# Patient Record
Sex: Male | Born: 1965 | Race: White | Hispanic: No | Marital: Married | Smoking: Never smoker
Health system: Southern US, Community
[De-identification: ages and names within clinical notes are randomized; demographics above are authoritative.]

---

## 2002-02-23 ENCOUNTER — Emergency Department (HOSPITAL_COMMUNITY): Admission: EM | Admit: 2002-02-23 | Discharge: 2002-02-23 | Payer: Self-pay | Admitting: Emergency Medicine

## 2002-02-23 ENCOUNTER — Encounter: Payer: Self-pay | Admitting: Emergency Medicine

## 2004-04-03 ENCOUNTER — Emergency Department (HOSPITAL_COMMUNITY): Admission: EM | Admit: 2004-04-03 | Discharge: 2004-04-04 | Payer: Self-pay | Admitting: Emergency Medicine

## 2009-08-26 ENCOUNTER — Emergency Department (HOSPITAL_COMMUNITY): Admission: EM | Admit: 2009-08-26 | Discharge: 2009-08-26 | Payer: Self-pay | Admitting: Emergency Medicine

## 2010-02-05 ENCOUNTER — Emergency Department (HOSPITAL_COMMUNITY): Admission: EM | Admit: 2010-02-05 | Discharge: 2010-02-05 | Payer: Self-pay | Admitting: Family Medicine

## 2010-02-14 ENCOUNTER — Ambulatory Visit: Payer: Self-pay | Admitting: Internal Medicine

## 2010-02-14 DIAGNOSIS — D509 Iron deficiency anemia, unspecified: Secondary | ICD-10-CM

## 2010-02-14 DIAGNOSIS — S8990XA Unspecified injury of unspecified lower leg, initial encounter: Secondary | ICD-10-CM | POA: Insufficient documentation

## 2010-02-14 DIAGNOSIS — K279 Peptic ulcer, site unspecified, unspecified as acute or chronic, without hemorrhage or perforation: Secondary | ICD-10-CM | POA: Insufficient documentation

## 2010-02-14 DIAGNOSIS — S99929A Unspecified injury of unspecified foot, initial encounter: Secondary | ICD-10-CM

## 2010-02-14 DIAGNOSIS — H9190 Unspecified hearing loss, unspecified ear: Secondary | ICD-10-CM | POA: Insufficient documentation

## 2010-02-14 DIAGNOSIS — K219 Gastro-esophageal reflux disease without esophagitis: Secondary | ICD-10-CM | POA: Insufficient documentation

## 2010-02-14 DIAGNOSIS — H6120 Impacted cerumen, unspecified ear: Secondary | ICD-10-CM

## 2010-02-14 DIAGNOSIS — R51 Headache: Secondary | ICD-10-CM

## 2010-02-14 DIAGNOSIS — S99919A Unspecified injury of unspecified ankle, initial encounter: Secondary | ICD-10-CM

## 2010-02-14 DIAGNOSIS — M7989 Other specified soft tissue disorders: Secondary | ICD-10-CM

## 2010-02-14 DIAGNOSIS — R519 Headache, unspecified: Secondary | ICD-10-CM | POA: Insufficient documentation

## 2010-08-13 NOTE — Assessment & Plan Note (Signed)
Summary: NEW / [employer] / # / CD   Vital Signs:  Patient profile:   45 year old male Height:      72 inches Weight:      280.50 pounds BMI:     38.18 O2 Sat:      96 % on Room air Temp:     98.4 degrees F oral Pulse rate:   88 / minute Pulse rhythm:   regular Resp:     16 per minute BP sitting:   136 / 88  (left arm) Cuff size:   large  Vitals Entered By: Rock Nephew CMA (February 14, 2010 4:07 PM)  Nutrition Counseling: Patient's BMI is greater than 25 and therefore counseled on weight management options.  O2 Flow:  Room air  Primary Care Provider:  Etta Grandchild MD   History of Present Illness: New to me he complains of a one year hx. of feeling like there is fluid in his ears with a decreased level of hearing.  Also, he has persistent pain and swelling in his right calf after he injured it 2 months ago when he jumped into a pool.  Preventive Screening-Counseling & Management  Alcohol-Tobacco     Alcohol drinks/day: 4     Alcohol type: beer     >5/day in last 3 mos: yes     Alcohol Counseling: to decrease amount and/or frequency of alcohol intake     Feels need to cut down: no     Feels annoyed by complaints: no     Feels guilty re: drinking: no     Needs 'eye opener' in am: no     Smoking Status: never  Caffeine-Diet-Exercise     Does Patient Exercise: no  Hep-HIV-STD-Contraception     Hepatitis Risk: no risk noted     HIV Risk: no risk noted     STD Risk: no risk noted  Safety-Violence-Falls     Seat Belt Use: yes     Helmet Use: yes     Firearms in the Home: firearms in the home     Firearm Counseling: to practice firearm safety     Smoke Detectors: yes     Violence in the Home: no risk noted     Sexual Abuse: no      Sexual History:  currently monogamous.        Drug Use:  no.        Blood Transfusions:  no.    Medications Prior to Update: 1)  None  Current Medications (verified): 1)  Prilosec Otc 2)  Fish Oil 3)  Saw Palmetto .... Take 1  Tablet By Mouth Two Times A Day 4)  Iron .... Take 1 Tablet By Mouth Once A Day 5)  Potassium Gluconate 6)  Glucosamine Chondroitin  Allergies (verified): No Known Drug Allergies  Past History:  Past Medical History: Anemia-iron deficiency GERD Headache Peptic ulcer disease Congenital nerve damage left eye  Past Surgical History: Inguinal herniorrhaphy right knee surgery  Family History: Family History of Alcoholism/Addiction Family History of Arthritis Family History Diabetes 1st degree relative  Social History: Occupation: Librarian, academic Married Never Smoked Alcohol use-yes Drug use-no Regular exercise-no Smoking Status:  never Hepatitis Risk:  no risk noted HIV Risk:  no risk noted STD Risk:  no risk noted Seat Belt Use:  yes Sexual History:  currently monogamous Blood Transfusions:  no Drug Use:  no Does Patient Exercise:  no  Review of Systems  The patient complains of decreased hearing.  The patient denies anorexia, fever, chest pain, syncope, peripheral edema, prolonged cough, headaches, hemoptysis, abdominal pain, melena, hematochezia, severe indigestion/heartburn, hematuria, suspicious skin lesions, difficulty walking, depression, enlarged lymph nodes, and angioedema.   ENT:  Complains of decreased hearing; denies difficulty swallowing, ear discharge, earache, hoarseness, nasal congestion, nosebleeds, postnasal drainage, ringing in ears, sinus pressure, and sore throat. MS:  Complains of joint pain and stiffness; denies joint redness, joint swelling, loss of strength, low back pain, mid back pain, muscle aches, muscle, cramps, muscle weakness, and thoracic pain.  Physical Exam  General:  alert, well-developed, well-nourished, well-hydrated, appropriate dress, normal appearance, healthy-appearing, cooperative to examination, and good hygiene.   Head:  normocephalic, atraumatic, no abnormalities palpated, and no alopecia.   Eyes:  left ptosis,  right normal Ears:  R ear normal and L Cerumen impaction.   Nose:  External nasal examination shows no deformity or inflammation. Nasal mucosa are pink and moist without lesions or exudates. Mouth:  Oral mucosa and oropharynx without lesions or exudates.  Teeth in good repair. Neck:  supple, full ROM, no masses, no thyromegaly, no JVD, normal carotid upstroke, no carotid bruits, no cervical lymphadenopathy, and no neck tenderness.   Lungs:  normal respiratory effort, no intercostal retractions, no accessory muscle use, normal breath sounds, no dullness, no fremitus, no crackles, and no wheezes.   Heart:  normal rate, regular rhythm, no murmur, no gallop, no rub, and no JVD.   Abdomen:  soft, non-tender, normal bowel sounds, no distention, no masses, no guarding, no rigidity, no rebound tenderness, no abdominal hernia, no inguinal hernia, no hepatomegaly, and no splenomegaly.   Msk:  normal ROM, no joint tenderness, no joint swelling, no joint warmth, no redness over joints, no joint deformities, no joint instability, and no crepitation.  right calf is mildly swollen but there is no edema. there is a large elliptical scar over the right lower/anterior knee. Pulses:  R and L carotid,radial,femoral,dorsalis pedis and posterior tibial pulses are full and equal bilaterally Extremities:  No clubbing, cyanosis, edema, or deformity noted with normal full range of motion of all joints.   Neurologic:  No cranial nerve deficits noted. Station and gait are normal. Plantar reflexes are down-going bilaterally. DTRs are symmetrical throughout. Sensory, motor and coordinative functions appear intact. Skin:  turgor normal, color normal, no rashes, no suspicious lesions, no ecchymoses, no petechiae, no purpura, no ulcerations, and no edema.   Cervical Nodes:  no anterior cervical adenopathy and no posterior cervical adenopathy.   Axillary Nodes:  no R axillary adenopathy and no L axillary adenopathy.   Psych:   Cognition and judgment appear intact. Alert and cooperative with normal attention span and concentration. No apparent delusions, illusions, hallucinations Additional Exam:  left ear was cleaned with colace then irrigated and scooped out, all of the wax was removed and he tolerated it well. the ear exam is normal now.   Impression & Recommendations:  Problem # 1:  CERUMEN IMPACTION, LEFT (ICD-380.4)  Orders: Cerumen Impaction Removal (16109)  Problem # 2:  INJURY OTHER&UNSPECIFIED KNEE LEG ANKLE&FOOT (ICD-959.7) Assessment: New will look for an occult fracture, etc. Orders: T-Tib/Fib Right (73590TC)  Problem # 3:  SWELLING, LIMB (ICD-729.81) Assessment: New will look for venous abnormalities Orders: Doppler Referral (Doppler)  Complete Medication List: 1)  Prilosec Otc  2)  Fish Oil  3)  Saw Palmetto  .... Take 1 tablet by mouth two times a day 4)  Iron  .Marland KitchenMarland KitchenMarland Kitchen  Take 1 tablet by mouth once a day 5)  Potassium Gluconate  6)  Glucosamine Chondroitin   Other Orders: Audiology (Audio)  Colorectal Screening:  Colonoscopy Results:    Date of Exam: 04/02/2005    Results: Normal  Immunization & Chemoprophylaxis:    Tetanus vaccine: Historical  (01/14/2005)  Patient Instructions: 1)  Please schedule a follow-up appointment in 2 weeks. 2)  It is important that you exercise regularly at least 20 minutes 5 times a week. If you develop chest pain, have severe difficulty breathing, or feel very tired , stop exercising immediately and seek medical attention. 3)  You need to lose weight. Consider a lower calorie diet and regular exercise.      Tetanus/Td Immunization History:    Tetanus/Td # 1:  Historical (01/14/2005)

## 2010-09-28 LAB — POCT I-STAT, CHEM 8
BUN: 8 mg/dL (ref 6–23)
Calcium, Ion: 1.01 mmol/L — ABNORMAL LOW (ref 1.12–1.32)
Chloride: 101 mEq/L (ref 96–112)
Creatinine, Ser: 1.2 mg/dL (ref 0.4–1.5)
Glucose, Bld: 108 mg/dL — ABNORMAL HIGH (ref 70–99)
HCT: 52 % (ref 39.0–52.0)
Hemoglobin: 17.7 g/dL — ABNORMAL HIGH (ref 13.0–17.0)
Potassium: 4.2 mEq/L (ref 3.5–5.1)
Sodium: 132 mEq/L — ABNORMAL LOW (ref 135–145)
TCO2: 24 mmol/L (ref 0–100)

## 2010-09-28 LAB — CBC
HCT: 48.3 % (ref 39.0–52.0)
Hemoglobin: 16.7 g/dL (ref 13.0–17.0)
MCH: 31.9 pg (ref 26.0–34.0)
MCHC: 34.6 g/dL (ref 30.0–36.0)
MCV: 92.1 fL (ref 78.0–100.0)
Platelets: 164 10*3/uL (ref 150–400)
RBC: 5.24 MIL/uL (ref 4.22–5.81)
RDW: 12.8 % (ref 11.5–15.5)
WBC: 12.9 10*3/uL — ABNORMAL HIGH (ref 4.0–10.5)

## 2010-09-28 LAB — DIFFERENTIAL
Basophils Absolute: 0 10*3/uL (ref 0.0–0.1)
Basophils Relative: 0 % (ref 0–1)
Eosinophils Absolute: 0 10*3/uL (ref 0.0–0.7)
Eosinophils Relative: 0 % (ref 0–5)
Lymphocytes Relative: 9 % — ABNORMAL LOW (ref 12–46)
Lymphs Abs: 1.1 10*3/uL (ref 0.7–4.0)
Monocytes Absolute: 0.9 10*3/uL (ref 0.1–1.0)
Monocytes Relative: 7 % (ref 3–12)
Neutro Abs: 10.8 10*3/uL — ABNORMAL HIGH (ref 1.7–7.7)
Neutrophils Relative %: 84 % — ABNORMAL HIGH (ref 43–77)

## 2010-11-11 ENCOUNTER — Ambulatory Visit (INDEPENDENT_AMBULATORY_CARE_PROVIDER_SITE_OTHER): Payer: BC Managed Care – PPO | Admitting: Internal Medicine

## 2010-11-11 ENCOUNTER — Ambulatory Visit (INDEPENDENT_AMBULATORY_CARE_PROVIDER_SITE_OTHER)
Admission: RE | Admit: 2010-11-11 | Discharge: 2010-11-11 | Disposition: A | Discharge status: Home / Self Care | Payer: BC Managed Care – PPO | Source: Ambulatory Visit | Attending: Internal Medicine | Admitting: Internal Medicine

## 2010-11-11 ENCOUNTER — Encounter: Payer: Self-pay | Admitting: Internal Medicine

## 2010-11-11 VITALS — BP 120/82 | HR 94 | Temp 98.8°F | Resp 16 | Wt 288.0 lb

## 2010-11-11 DIAGNOSIS — M84376A Stress fracture, unspecified foot, initial encounter for fracture: Secondary | ICD-10-CM

## 2010-11-11 DIAGNOSIS — M79609 Pain in unspecified limb: Secondary | ICD-10-CM

## 2010-11-11 DIAGNOSIS — M8430XA Stress fracture, unspecified site, initial encounter for fracture: Secondary | ICD-10-CM

## 2010-11-11 DIAGNOSIS — M79672 Pain in left foot: Secondary | ICD-10-CM | POA: Insufficient documentation

## 2010-11-11 MED ORDER — NAPROXEN-ESOMEPRAZOLE 500-20 MG PO TBEC: 1.0000 | DELAYED_RELEASE_TABLET | Freq: Two times a day (BID) | ORAL | Status: DC | PRN

## 2010-11-11 NOTE — Progress Notes (Signed)
  Subjective:    Patient ID: Timothy Shah, male    DOB: [birth year], 45 y.o.   MRN: [id number]  Foot Pain This is a chronic problem. The current episode started more than 1 month ago. The problem occurs intermittently. The problem has been gradually worsening. Associated symptoms include arthralgias. Pertinent negatives include no joint swelling, myalgias, numbness, rash or weakness. The symptoms are aggravated by walking and standing. He has tried rest and NSAIDs for the symptoms. The treatment provided mild relief.      Review of Systems  Musculoskeletal: Positive for arthralgias. Negative for myalgias, back pain, joint swelling and gait problem.  Skin: Negative for rash.  Neurological: Negative for dizziness, weakness and numbness.       Objective:   Physical Exam  Musculoskeletal:       Left ankle: Normal. He exhibits normal range of motion, no swelling, no ecchymosis and no deformity.       Left foot: Normal. He exhibits normal range of motion, no tenderness, no bony tenderness, no swelling, normal capillary refill, no crepitus and no deformity.          Assessment & Plan:

## 2010-11-11 NOTE — Patient Instructions (Signed)
Foot Sprain The muscles and cord like structures which attach muscle to bone (tendons) that surround the feet are made up of units. A foot sprain can occur at the weakest spot in any of these units. This condition is most often caused by injury to or overuse of the foot, as from playing contact sports, or aggravating a previous injury, or from poor conditioning, or obesity.  DIAGNOSIS Diagnosis of this condition is usually by your own observation. If problems continue, a caregiver may be required for further evaluation and treatment. X-rays may be required to make sure there are not breaks in the bones (fractures) present. Continued problems may require physical therapy for treatment. SYMPTOMS OF FOOT SPRAIN ARE:  Pain with movement of the foot.   Tenderness and swelling at the injury site.   Loss of strength is present in moderate or severe sprains.  THE THREE GRADES OR SEVERITY OF FOOT SPRAIN ARE:  Mild (Grade I): Slightly pulled muscle without tearing of muscle or tendon fibers or loss of strength.   Moderate (Grade II): Tearing of fibers in a muscle, tendon, or at the attachment to bone, with small decrease in strength.   Severe (Grade III): Rupture of the muscle-tendon-bone attachment, with separation of fibers. Severe sprain requires surgical repair. Often repeating (chronic) sprains are caused by overuse. Sudden (acute) sprains are caused by direct injury or over-use.  HOME CARE INSTRUCTIONS  Apply ice to the injury for 20 minutes,3 times per day. Put the ice in a plastic bag and place a towel between the bag of ice and your skin.   An elastic wrap (like an Ace bandage) may be used to keep swelling down.   Keep foot above the level of the heart, or at least raised on a footstool, when swelling and pain are present.   Try to avoid use other than gentle range of motion while the foot is painful. Do not resume use until instructed by your caregiver. Then begin use gradually, not  increasing use to the point of pain. If pain does develop, decrease use and continue the above measures, gradually increasing activities that do not cause discomfort, until you gradually achieve normal use.   Use crutches if and as instructed, and for the length of time instructed.   Keep injured foot and ankle wrapped between treatments.   Massage foot and ankle for comfort and to keep swelling down. Massage from the toes up towards the knee.   Only take over-the-counter or prescription medicines for pain, discomfort, or fever as directed by your caregiver.  PREVENTION OF FOOT SPRAIN  Use strength and conditioning exercises appropriate for your sport.   Warm up properly prior to working out.   Use athletic shoes that are made for the sport you are participating in.   Allow adequate time for healing. Early return to activities makes repeat injury more likely, and can lead to an unstable arthritic foot that can result in prolonged disability. Mild sprains generally heal in 3 to 10 days, with moderate and severe sprains taking 2 to 10 weeks. Your caregiver can help you determine the proper time required for healing.  SEEK IMMEDIATE MEDICAL CARE IF:  Your pain and swelling increase, or pain is not controlled with medications.   You have loss of feeling in your foot or your foot turns cold or blue.   You develop new, unexplained symptoms, or an increase of the symptoms that brought you to your caregiver.  MAKE SURE YOU:  Understand these instructions.   Will watch your condition.   Will get help right away if you are not doing well or get worse.  Document Released: 12/20/2001 Document Re-Released: 09/24/2009 [facility] Patient Information 2011 [place], [place].Place foot pain patient instructions here.

## 2010-11-13 ENCOUNTER — Encounter: Payer: Self-pay | Admitting: Internal Medicine

## 2010-11-13 DIAGNOSIS — M84376A Stress fracture, unspecified foot, initial encounter for fracture: Secondary | ICD-10-CM | POA: Insufficient documentation

## 2010-11-13 NOTE — Assessment & Plan Note (Signed)
Xray shows a stress fracture in calc. So I have offered an ortho referral

## 2010-11-13 NOTE — Assessment & Plan Note (Signed)
Ortho referral  

## 2013-11-10 ENCOUNTER — Ambulatory Visit: Payer: Self-pay

## 2013-11-10 ENCOUNTER — Other Ambulatory Visit: Payer: Self-pay | Admitting: Occupational Medicine

## 2013-11-10 DIAGNOSIS — R52 Pain, unspecified: Secondary | ICD-10-CM

## 2014-02-01 ENCOUNTER — Emergency Department (HOSPITAL_COMMUNITY): Payer: Managed Care, Other (non HMO)

## 2014-02-01 ENCOUNTER — Emergency Department (HOSPITAL_COMMUNITY): Payer: Self-pay

## 2014-02-01 ENCOUNTER — Encounter (HOSPITAL_COMMUNITY): Payer: Self-pay | Admitting: Emergency Medicine

## 2014-02-01 ENCOUNTER — Emergency Department (HOSPITAL_COMMUNITY)
Admission: EM | Admit: 2014-02-01 | Discharge: 2014-02-01 | Disposition: A | Discharge status: Home / Self Care | Payer: Self-pay | Attending: Emergency Medicine | Admitting: Emergency Medicine

## 2014-02-01 DIAGNOSIS — N509 Disorder of male genital organs, unspecified: Secondary | ICD-10-CM | POA: Insufficient documentation

## 2014-02-01 DIAGNOSIS — R109 Unspecified abdominal pain: Secondary | ICD-10-CM | POA: Insufficient documentation

## 2014-02-01 DIAGNOSIS — N5089 Other specified disorders of the male genital organs: Secondary | ICD-10-CM | POA: Insufficient documentation

## 2014-02-01 DIAGNOSIS — IMO0002 Reserved for concepts with insufficient information to code with codable children: Secondary | ICD-10-CM

## 2014-02-01 DIAGNOSIS — Z79899 Other long term (current) drug therapy: Secondary | ICD-10-CM | POA: Insufficient documentation

## 2014-02-01 LAB — BASIC METABOLIC PANEL
Anion gap: 13 (ref 5–15)
BUN: 11 mg/dL (ref 6–23)
CO2: 25 meq/L (ref 19–32)
Calcium: 9.3 mg/dL (ref 8.4–10.5)
Chloride: 102 mEq/L (ref 96–112)
Creatinine, Ser: 0.71 mg/dL (ref 0.50–1.35)
GFR calc Af Amer: >90 mL/min (ref >90)
GFR calc non Af Amer: >90 mL/min (ref >90)
GLUCOSE: 93 mg/dL (ref 70–99)
POTASSIUM: 4.3 meq/L (ref 3.7–5.3)
SODIUM: 140 meq/L (ref 137–147)

## 2014-02-01 LAB — CBC WITH DIFFERENTIAL/PLATELET
Basophils Absolute: 0 10*3/uL (ref 0.0–0.1)
Basophils Relative: 0 % (ref 0–1)
EOS PCT: 3 % (ref 0–5)
Eosinophils Absolute: 0.2 10*3/uL (ref 0.0–0.7)
HCT: 45.6 % (ref 39.0–52.0)
Hemoglobin: 15.4 g/dL (ref 13.0–17.0)
LYMPHS ABS: 2.3 10*3/uL (ref 0.7–4.0)
LYMPHS PCT: 28 % (ref 12–46)
MCH: 31.2 pg (ref 26.0–34.0)
MCHC: 33.8 g/dL (ref 30.0–36.0)
MCV: 92.3 fL (ref 78.0–100.0)
Monocytes Absolute: 0.5 10*3/uL (ref 0.1–1.0)
Monocytes Relative: 6 % (ref 3–12)
NEUTROS PCT: 63 % (ref 43–77)
Neutro Abs: 5.1 10*3/uL (ref 1.7–7.7)
Platelets: 213 10*3/uL (ref 150–400)
RBC: 4.94 MIL/uL (ref 4.22–5.81)
RDW: 13.2 % (ref 11.5–15.5)
WBC: 8 10*3/uL (ref 4.0–10.5)

## 2014-02-01 LAB — URINALYSIS, ROUTINE W REFLEX MICROSCOPIC
Bilirubin Urine: NEGATIVE
Glucose, UA: NEGATIVE mg/dL
Hgb urine dipstick: NEGATIVE
Ketones, ur: NEGATIVE mg/dL
LEUKOCYTES UA: NEGATIVE
Nitrite: NEGATIVE
PH: 5.5 (ref 5.0–8.0)
Protein, ur: NEGATIVE mg/dL
SPECIFIC GRAVITY, URINE: 1.023 (ref 1.005–1.030)
Urobilinogen, UA: 0.2 mg/dL (ref 0.0–1.0)

## 2014-02-01 MED ORDER — IBUPROFEN 600 MG PO TABS: 600.0000 mg | ORAL_TABLET | Freq: Four times a day (QID) | ORAL | Status: AC | PRN

## 2014-02-01 MED ORDER — HYDROCODONE-ACETAMINOPHEN 5-325 MG PO TABS
2.0000 | ORAL_TABLET | Freq: Once | ORAL | Status: AC
  Administered 2014-02-01: 2 via ORAL
  Filled 2014-02-01: qty 2

## 2014-02-01 MED ORDER — HYDROCODONE-ACETAMINOPHEN 5-325 MG PO TABS: 1.0000 | ORAL_TABLET | Freq: Four times a day (QID) | ORAL | Status: AC | PRN

## 2014-02-01 MED ORDER — KETOROLAC TROMETHAMINE 30 MG/ML IJ SOLN
60.0000 mg | Freq: Once | INTRAMUSCULAR | Status: AC
  Administered 2014-02-01: 60 mg via INTRAMUSCULAR
  Filled 2014-02-01: qty 2

## 2014-02-01 MED ORDER — HYDROMORPHONE HCL PF 1 MG/ML IJ SOLN
1.0000 mg | Freq: Once | INTRAMUSCULAR | Status: AC
  Administered 2014-02-01: 1 mg via INTRAMUSCULAR
  Filled 2014-02-01: qty 1

## 2014-02-01 NOTE — Discharge Instructions (Signed)
We saw you in the ER for the scrotal pain. All the results in the ER are normal, labs and imaging. We are not sure what is causing your symptoms. The workup in the ER is not complete, and is limited to screening for life threatening and emergent conditions only, so please see a primary care doctor for further evaluation.  PLEASE READ THE INFORMATION BELOW. TAKE IBUPROFEN, ICE THE SCROTUM 2-3 TIMES A DAY, AND USE UNDERWEAR THAT CAN GIVE SUPPORT. SEE UROLOGIST IF NOT IMPROVING.   Scrotal Swelling Scrotal swelling may occur on one or both sides of the scrotum. Pain may also occur with swelling. Possible causes of scrotal swelling include:   Injury.  Infection.  An ingrown hair or abrasion in the area.  Repeated rubbing from tight-fitting underwear.  Poor hygiene.  A weakened area in the muscles around the groin (hernia). A hernia can allow abdominal contents to push into the scrotum.  Fluid around the testicle (hydrocele).  Enlarged vein around the testicle (varicocele).  Certain medical treatments or existing conditions.  A recent genital surgery or procedure.  The spermatic cord becomes twisted in the scrotum, which cuts off blood supply (testicular torsion).  Testicular cancer. HOME CARE INSTRUCTIONS Once the cause of your scrotal swelling has been determined, you may be asked to monitor your scrotum for any changes. The following actions may help to alleviate any discomfort you are experiencing:  Rest and limit activity until the swelling goes away. Lying down is the preferred position.  Put ice on the scrotum:  Put ice in a plastic bag.  Place a towel between your skin and the bag.  Leave the ice on for 20 minutes, 2-3 times a day for 1-2 days.  Place a rolled towel under the testicles for support.  Wear loose-fitting clothing or an athletic support cup for comfort.  Take all medicines as directed by your health care provider.  Perform a monthly self-exam of  the scrotum and penis. Feel for changes. Ask your health care provider how to perform a monthly self-exam if you are unsure. SEEK MEDICAL CARE IF:  You have a sudden (acute) onset of pain that is persistent and not improving.  You notice a heavy feeling or fluid in the scrotum.  You have pain or burning while urinating.  You have blood in the urine or semen.  You feel a lump around the testicle.  You notice that one testicle is larger than the other (slight variation is normal).  You have a persistent dull ache or pain in the groin or scrotum. SEEK IMMEDIATE MEDICAL CARE IF:  The pain does not go away or becomes severe.  You have a fever or shaking chills.  You have pain or vomiting that cannot be controlled.  You notice significant redness or swelling of one or both sides of the scrotum.  You experience redness spreading upward from your scrotum to your abdomen or downward from your scrotum to your thighs. MAKE SURE YOU:  Understand these instructions.  Will watch your condition.  Will get help right away if you are not doing well or get worse. Document Released: 08/02/2010 Document Revised: 03/02/2013 Document Reviewed: 12/02/2012 [facility] Patient Information 2015 [place], [place]. This information is not intended to replace advice given to you by your health care provider. Make sure you discuss any questions you have with your health care provider.

## 2014-02-01 NOTE — ED Provider Notes (Signed)
CSN: [id number]     Arrival date & time 02/01/14  1500 History   First MD Initiated Contact with Patient 02/01/14 1546     Chief Complaint  Patient presents with  . Groin Pain     (Consider location/radiation/quality/duration/timing/severity/associated sxs/prior Treatment) HPI Comments: 48 y/o M comes in with cc of groin pain. Pain started in the groin on Sunday, and the pain has moves to the testicle since then. The pain is constant, worse with sitting, better with laying. No bulging. No n/v/f/c. Pt has no hx of similar pain. No medical hx, and no trauma hx to the groin.  Patient is a 48 y.o. male presenting with groin pain. The history is provided by the patient.  Groin Pain Pertinent negatives include no chest pain, no abdominal pain and no shortness of breath.    History reviewed. No pertinent past medical history. History reviewed. No pertinent past surgical history. History reviewed. No pertinent family history. History  Substance Use Topics  . Smoking status: Never Smoker   . Smokeless tobacco: Not on file  . Alcohol Use: No    Review of Systems  Constitutional: Negative for activity change and appetite change.  Respiratory: Negative for cough and shortness of breath.   Cardiovascular: Negative for chest pain.  Gastrointestinal: Negative for abdominal pain.  Genitourinary: Positive for scrotal swelling and testicular pain. Negative for dysuria, frequency, hematuria and flank pain.  Musculoskeletal: Positive for back pain.      Allergies  Review of patient's allergies indicates no known allergies.  Home Medications   Prior to Admission medications   Medication Sig Start Date End Date Taking? Authorizing Provider  ibuprofen (ADVIL,MOTRIN) 200 MG tablet Take 200 mg by mouth every 6 (six) hours as needed for mild pain.   Yes Historical Provider, MD  naproxen sodium (ANAPROX) 220 MG tablet Take 220 mg by mouth as needed (pain).   Yes Historical Provider, MD   omeprazole (PRILOSEC OTC) 20 MG tablet Take 20 mg by mouth daily.     Yes Historical Provider, MD  ropinirole (REQUIP) 5 MG tablet Take 10 mg by mouth at bedtime.   Yes Historical Provider, MD  HYDROcodone-acetaminophen (NORCO/VICODIN) 5-325 MG per tablet Take 1 tablet by mouth every 6 (six) hours as needed. 02/01/14   Derwood KaplanAnkit Jayln Madeira, MD  ibuprofen (ADVIL,MOTRIN) 600 MG tablet Take 1 tablet (600 mg total) by mouth every 6 (six) hours as needed. 02/01/14   Mandolin Falwell Rhunette CroftNanavati, MD   BP 135/70  Pulse 62  Temp(Src) 98.8 F (37.1 C) (Oral)  Resp 18  Ht 6' (1.829 m)  Wt 295 lb (133.811 kg)  BMI 40.00 kg/m2  SpO2 93% Physical Exam  Nursing note and vitals reviewed. Constitutional: He appears well-developed.  HENT:  Head: Atraumatic.  Eyes: Conjunctivae are normal.  Neck: Neck supple.  Cardiovascular: Normal rate.   Pulmonary/Chest: Effort normal.  Abdominal: Soft. There is no tenderness.  Genitourinary: Penis normal.  Pt's right hemiscrotum is slightly more swollen compared to the contra-lateral side. There is flank pain on the right side. No rash, no abscess. No hernia palpated on exam of the inguinal canal.   Skin: Skin is warm.    ED Course  Procedures (including critical care time) Labs Review Labs Reviewed  CBC WITH DIFFERENTIAL  BASIC METABOLIC PANEL  URINALYSIS, ROUTINE W REFLEX MICROSCOPIC    Imaging Review Ct Abdomen Pelvis Wo Contrast  02/01/2014   CLINICAL DATA:  Right groin, testicle and back pain for 4 days. Right scrotal swelling.  EXAM: CT ABDOMEN AND PELVIS WITHOUT CONTRAST  TECHNIQUE: Multidetector CT imaging of the abdomen and pelvis was performed following the standard protocol without IV contrast.  COMPARISON:  Scrotal ultrasound 02/01/2014.  FINDINGS: Lung bases: 3 mm perifissural nodule along the inferior aspect of the right major fissure on image number 2 is likely a lymph node. The lung bases are otherwise clear. There is no pleural or pericardial effusion.  Liver:  Diffusely decreased density consistent with steatosis. No focal lesions are observed.  Gallbladder/Biliary system: No evidence of gallstones, gallbladder wall thickening or biliary dilatation.  Pancreas: Unremarkable.  Spleen: Normal in size without focal abnormality.  Adrenal glands: No adrenal mass.  Kidneys/Ureters/Bladder: The left kidney is ptotic and laterally rotated. 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 There is no evidence of renal mass, hydronephrosis or urinary tract calculus. The bladder appears unremarkable.  Bowel/Peritoneum: The stomach, small bowel, appendix and colon demonstrate no significant findings. There are mild diverticular changes of the sigmoid colon without surrounding inflammation. There is no evidence of ascites or peritoneal nodularity.  Lymph Nodes: No enlarged abdominal or pelvic lymph nodes.  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  Reproductive Organs: There are small central dystrophic calcifications within the prostate gland which is not enlarged. The seminal vesicles appear normal. There are no pelvic inflammatory changes.  Abdominal wall: Small umbilical hernia containing fat. The inguinal canals and groins demonstrate no significant findings.  Musculoskeletal: There are bilateral L5 pars defects associated with a grade 1 anterolisthesis and significant biforaminal stenosis at L5-S1. No acute osseous findings demonstrated.  IMPRESSION: 1. No specific explanation for right groin pain demonstrated. There is no evidence of urinary tract calculus or inflammatory process. 2. Hepatic steatosis. 3. 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 4. 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 This would be expected to be symptomatic on the basis  of L5 nerve root encroachment.   Electronically Signed   By: Roxy Horseman M.D.   On: 02/01/2014 19:47   US Scrotum  02/01/2014   CLINICAL DATA:  RIGHT scrotal pain and swelling for 3 days.  EXAM: SCROTAL ULTRASOUND  DOPPLER ULTRASOUND OF THE TESTICLES  TECHNIQUE: Complete ultrasound examination of the testicles, epididymis, and other scrotal structures was performed. Color and spectral Doppler ultrasound were also utilized to evaluate blood flow to the testicles.  COMPARISON:  None.  FINDINGS: Right testicle  Measurements: 42 mm x 36 mm x 27 mm. No mass or microlithiasis visualized.  Left testicle  Measurements: 32 mm x 25 mm x 31 mm. No mass lesion. A few punctate calcifications are present, without associated mass lesion. 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  Right epididymis:  Normal in size and appearance.  Left epididymis: Epididymal cyst measuring 4 mm x 5 mm x 5 mm. Normal size.  Hydrocele:  Tiny RIGHT hydrocele.  Varicocele:  None visualized.  Pulsed Doppler interrogation of both testes demonstrates low resistance arterial and venous waveforms bilaterally.  Color flow symmetric bilaterally.  IMPRESSION: 1. Small RIGHT hydrocele. 2. Negative for testicular torsion, epididymitis or epididymoorchitis.   Electronically Signed   By: Andreas Newport M.D.   On: 02/01/2014 17:46   [place] Art/ven Flow Abd Pelv Doppler  02/01/2014   CLINICAL DATA:  RIGHT scrotal pain and swelling for 3 days.  EXAM: SCROTAL ULTRASOUND  DOPPLER ULTRASOUND OF THE TESTICLES  TECHNIQUE: Complete ultrasound examination of the testicles, epididymis, and other scrotal structures was performed. Color and spectral Doppler ultrasound were also utilized to evaluate blood flow to the testicles.  COMPARISON:  None.  FINDINGS: Right testicle  Measurements: 42 mm x 36 mm x 27 mm. No mass or microlithiasis visualized.  Left testicle  Measurements: 32 mm x 25 mm x 31 mm. No mass  lesion. A few punctate calcifications are present, without associated mass lesion. 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  Right epididymis:  Normal in size and appearance.  Left epididymis: Epididymal cyst measuring 4 mm x 5 mm x 5 mm. Normal size.  Hydrocele:  Tiny RIGHT hydrocele.  Varicocele:  None visualized.  Pulsed Doppler interrogation of both testes demonstrates low resistance arterial and venous waveforms bilaterally.  Color flow symmetric bilaterally.  IMPRESSION: 1. Small RIGHT hydrocele. 2. Negative for testicular torsion, epididymitis or epididymoorchitis.   Electronically Signed   By: Andreas Newport M.D.   On: 02/01/2014 17:46     EKG Interpretation None      MDM   Final diagnoses:  Testicular/scrotal pain    Pt with acute groin, scrotal pain and flank pain x 3 days. Right scrotum is bigger, but otherwise, no signs of infection/cellulitis.  Exam was benign, specifically GU exam. [place] ordered - and was negative as well. Pt still had tenderness, and so we got the CT scan to ensure there was no kidney stones - and that came back neg as well.  Unsure etiology. Clinically treating as epididymitis - traumatic type. Advised that if he is not better, to see urologist, as i dont think PCP will be able to further differentiate the cause either.     Derwood Kaplan, MD 02/01/14 2226

## 2014-02-01 NOTE — ED Notes (Signed)
Pt in c/o pain since Sunday to right groin area, right lower back, and right testicle, also constipation since Saturday- pt states he has noted mild swelling to right testicle since yesterday, pt uncomfortable to sit, less with pain standing or laying down, denies urinary symptoms

## 2014-02-01 NOTE — ED Notes (Signed)
Pt to US at this time.

## 2014-04-28 ENCOUNTER — Other Ambulatory Visit: Payer: Self-pay

## 2014-11-24 IMAGING — CT CT ABD-PELV W/O CM
2 of 4 series · 15 of 46 positions shown, 17 images · non-contrast
Comparison: Scrotal ultrasound 02/01/2014.

CLINICAL DATA: Right groin, testicle and back pain for 4 days.
Right scrotal swelling.

EXAM:
CT ABDOMEN AND PELVIS WITHOUT CONTRAST
TECHNIQUE: Multidetector CT imaging of the abdomen and pelvis was performed
following the standard protocol without IV contrast.

[Series 2: abd/ pelvis 5.0 i30f 1 · axial · 0.90mm/px · z∈[-523,-48]mm · 12 of 105 slices shown, 14 images]
[im 5/105  soft-tissue]
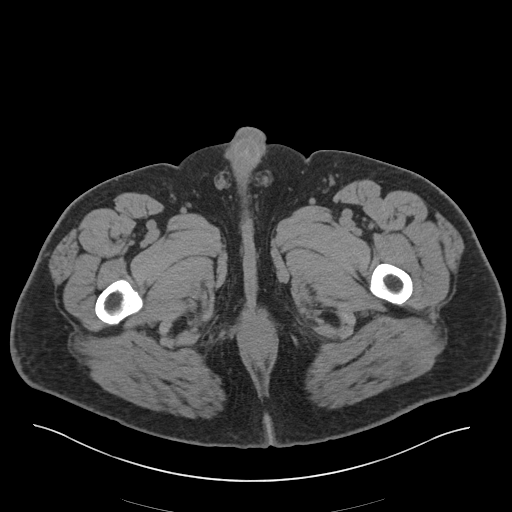
[im 5/105  bone]
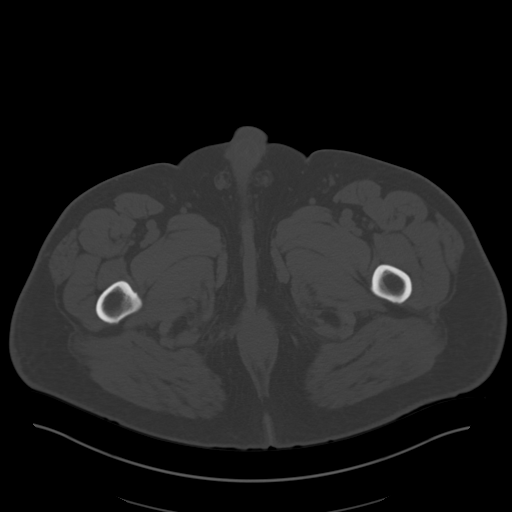
[im 14/105  soft-tissue]
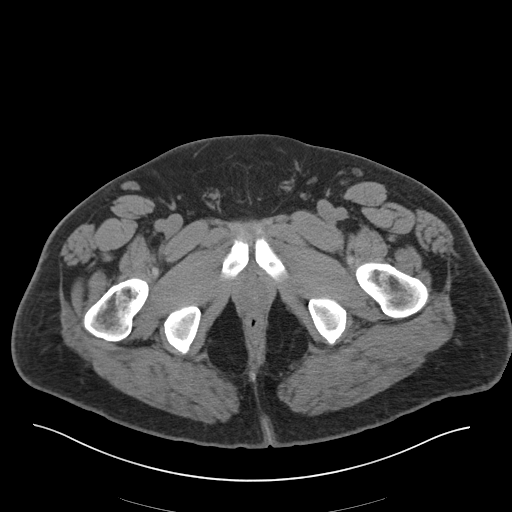
[im 23/105  soft-tissue]
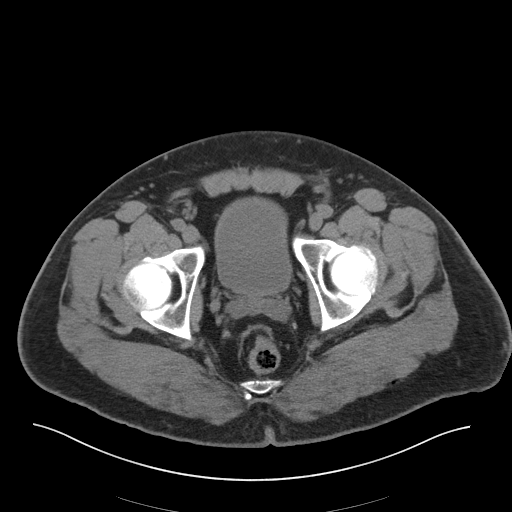
[im 32/105  soft-tissue]
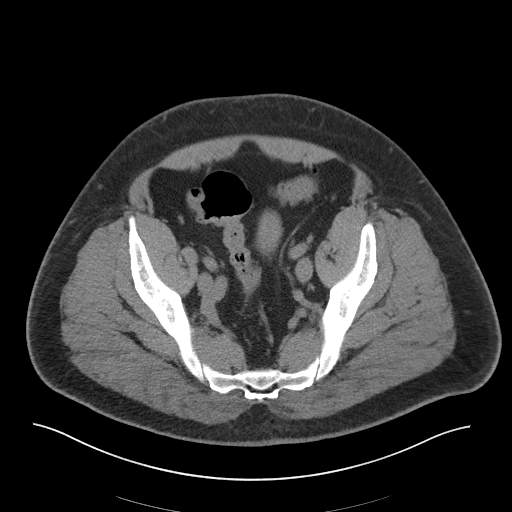
[im 41/105  soft-tissue]
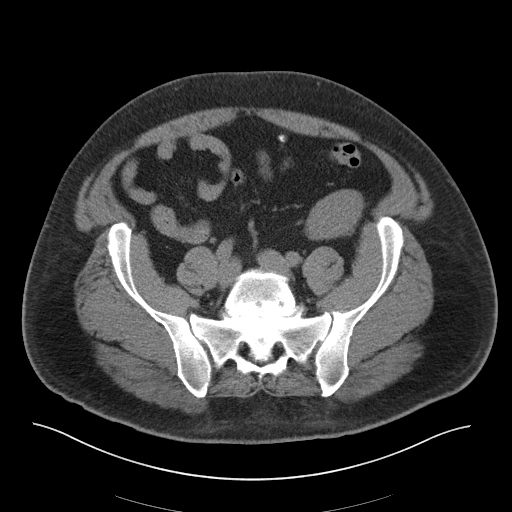
[im 50/105  soft-tissue]
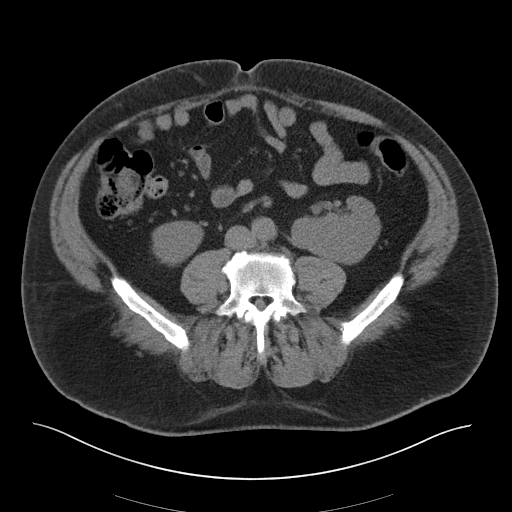
[im 55/105  soft-tissue]
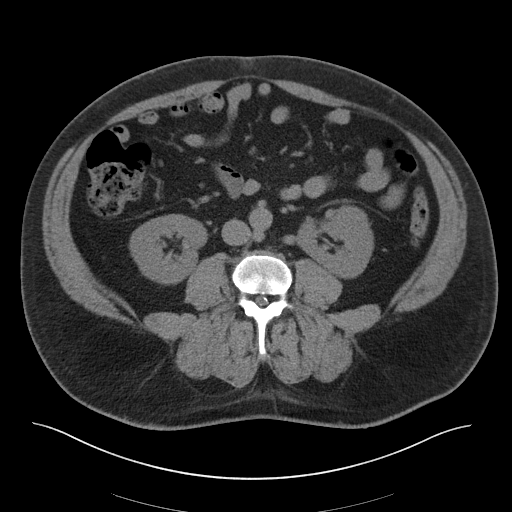
[im 64/105  soft-tissue]
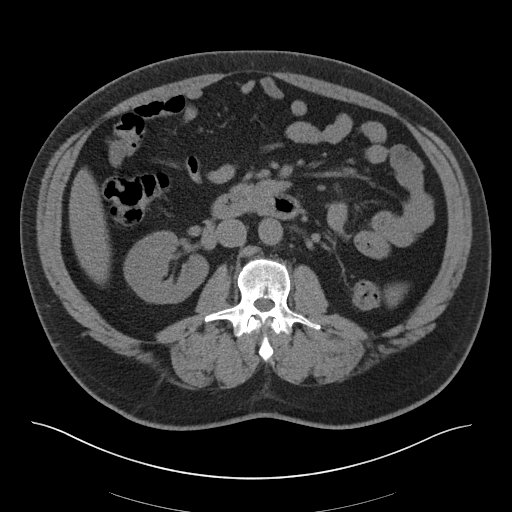
[im 73/105  soft-tissue]
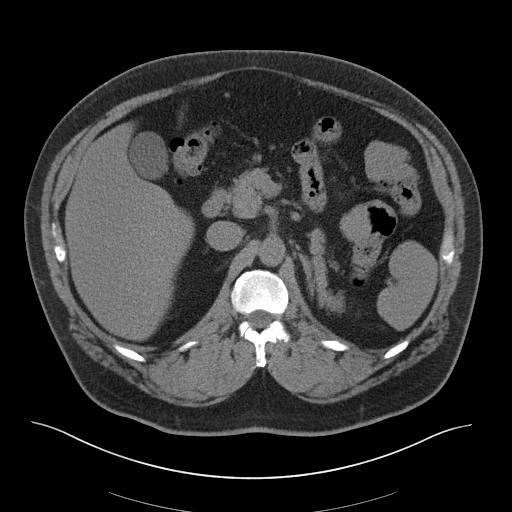
[im 73/105  bone]
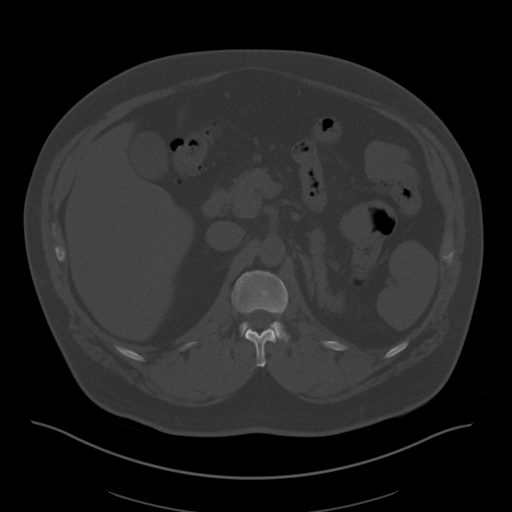
[im 82/105  soft-tissue]
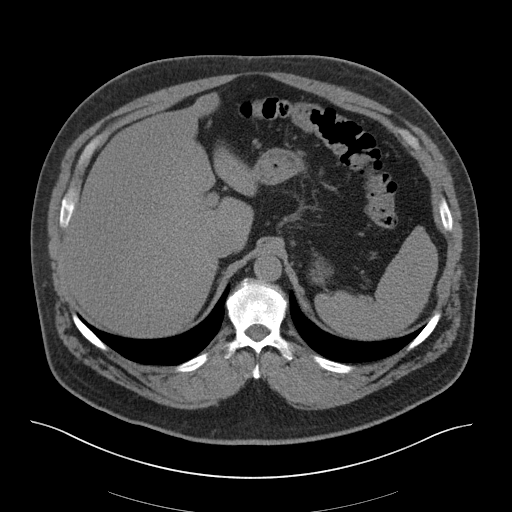
[im 91/105  soft-tissue]
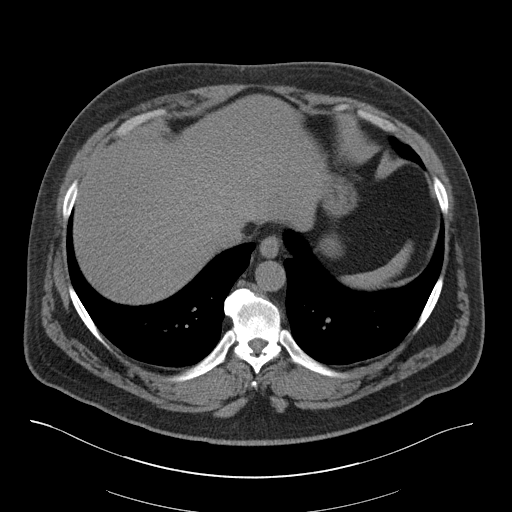
[im 100/105  soft-tissue]
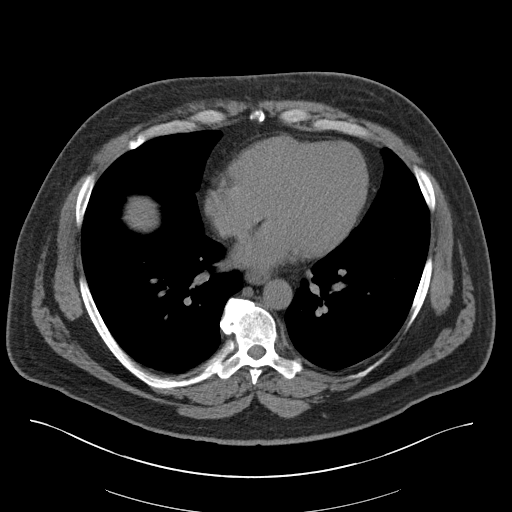

[Series 5: coronals · coronal · 0.80mm/px · 3 of 181 slices shown]
[im 61/181  soft-tissue]
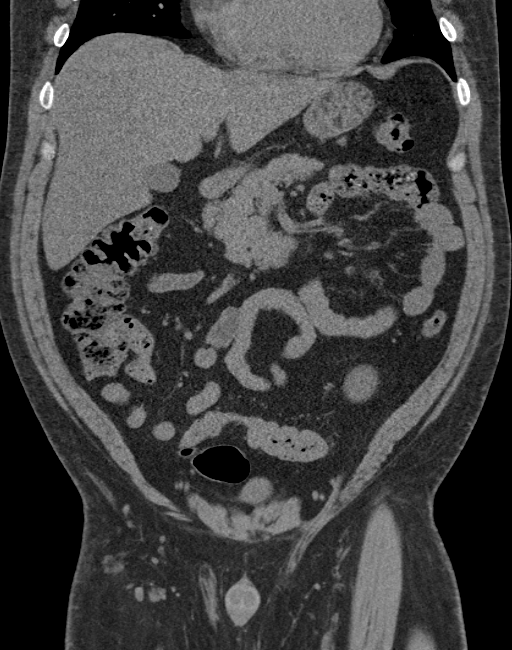
[im 81/181  soft-tissue]
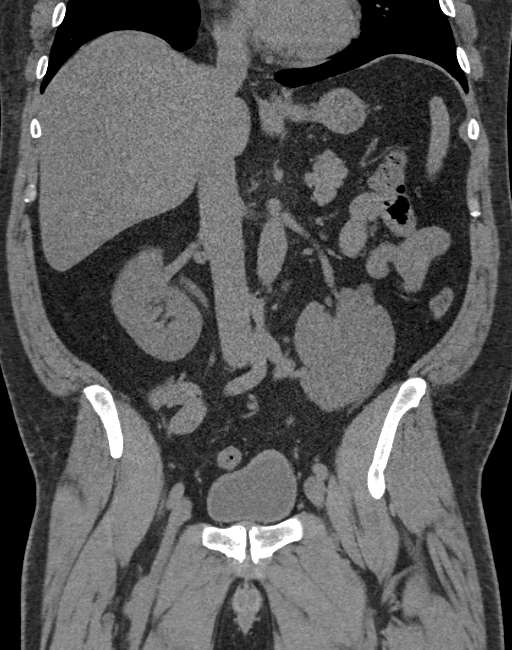
[im 101/181  soft-tissue]
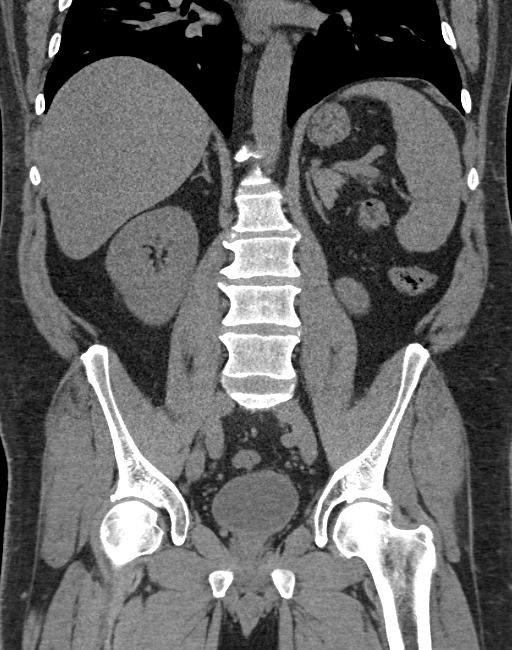

[15 of 46 positions shown; findings below may reference images not displayed]

FINDINGS: Lung bases: 3 mm perifissural nodule along the inferior aspect of
the right major fissure on image number 2 is likely a lymph node.
The lung bases are otherwise clear. There is no pleural or
pericardial effusion.

Liver: Diffusely decreased density consistent with steatosis. No
focal lesions are observed.

Gallbladder/Biliary system: No evidence of gallstones, gallbladder
wall thickening or biliary dilatation.

Pancreas: Unremarkable.

Spleen: Normal in size without focal abnormality.

Adrenal glands: No adrenal mass.

Kidneys/Ureters/Bladder: The left kidney is ptotic and laterally
rotated. It demonstrates an aberrant vein extending into the left
common iliac vein. There is no evidence of renal mass,
hydronephrosis or urinary tract calculus. The bladder appears
unremarkable.

Bowel/Peritoneum: The stomach, small bowel, appendix and colon
demonstrate no significant findings. There are mild diverticular
changes of the sigmoid colon without surrounding inflammation. There
is no evidence of ascites or peritoneal nodularity.

Lymph Nodes: No enlarged abdominal or pelvic lymph nodes.

Vascular Structures: Aside from congenital variants of the left
renal veins, no significant vascular abnormalities identified.

Reproductive Organs: There are small central dystrophic
calcifications within the prostate gland which is not enlarged. The
seminal vesicles appear normal. There are no pelvic inflammatory
changes.

Abdominal wall: Small umbilical hernia containing fat. The inguinal
canals and groins demonstrate no significant findings.

Musculoskeletal: There are bilateral L5 pars defects associated with
a grade 1 anterolisthesis and significant biforaminal stenosis at
L5-S1. No acute osseous findings demonstrated.
IMPRESSION: 1. No specific explanation for right groin pain demonstrated. There
is no evidence of urinary tract calculus or inflammatory process.
2. Hepatic steatosis.
3. Congenital variant of the left kidney, positioned in the false
pelvis with associated aberrant renal veins.
4. Bilateral L5 pars defects with grade 1 anterolisthesis and
significant biforaminal stenosis at L5-S1. This would be expected to
be symptomatic on the basis of L5 nerve root encroachment.
